# Patient Record
Sex: Male | Born: 1955 | Race: White | Hispanic: No
Health system: Southern US, Community
[De-identification: ages and names within clinical notes are randomized; demographics above are authoritative.]

## PROBLEM LIST (undated history)

## (undated) DIAGNOSIS — I10 Essential (primary) hypertension: Secondary | ICD-10-CM

## (undated) DIAGNOSIS — G454 Transient global amnesia: Principal | ICD-10-CM

## (undated) HISTORY — DX: Transient global amnesia: G45.4

---

## 2015-12-19 DIAGNOSIS — R4182 Altered mental status, unspecified: Secondary | ICD-10-CM | POA: Diagnosis present

## 2015-12-19 DIAGNOSIS — I10 Essential (primary) hypertension: Secondary | ICD-10-CM | POA: Insufficient documentation

## 2015-12-19 DIAGNOSIS — Z79899 Other long term (current) drug therapy: Secondary | ICD-10-CM | POA: Diagnosis not present

## 2015-12-19 DIAGNOSIS — G459 Transient cerebral ischemic attack, unspecified: Secondary | ICD-10-CM | POA: Insufficient documentation

## 2015-12-19 NOTE — ED Notes (Signed)
Pt screened by [facility].  Family reports pt unable to remember day of the week and if he worked today.  Wife states confusion started at 9pm.  Another visitor came up to [facility] and states they have been telling pt the day of the week multiple times and that disorientation started at 8pm.  No neuro deficits noted.  Pt alert and oriented at this time.  Pt states name, DOB, phone number, day of week, month, president, and location.

## 2015-12-20 ENCOUNTER — Emergency Department (HOSPITAL_COMMUNITY)
Admission: EM | Admit: 2015-12-20 | Discharge: 2015-12-20 | Disposition: A | Discharge status: Home / Self Care | Payer: BLUE CROSS/BLUE SHIELD | Attending: Emergency Medicine | Admitting: Emergency Medicine

## 2015-12-20 ENCOUNTER — Emergency Department (HOSPITAL_COMMUNITY): Payer: BLUE CROSS/BLUE SHIELD

## 2015-12-20 ENCOUNTER — Encounter (HOSPITAL_COMMUNITY): Payer: Self-pay | Admitting: Emergency Medicine

## 2015-12-20 DIAGNOSIS — G459 Transient cerebral ischemic attack, unspecified: Secondary | ICD-10-CM

## 2015-12-20 HISTORY — DX: Essential (primary) hypertension: I10

## 2015-12-20 LAB — COMPREHENSIVE METABOLIC PANEL
ALBUMIN: 3.9 g/dL (ref 3.5–5.0)
ALBUMIN: 4 g/dL (ref 3.5–5.0)
ALT: 30 U/L (ref 17–63)
ALT: 30 U/L (ref 17–63)
ANION GAP: 11 (ref 5–15)
AST: 51 U/L — ABNORMAL HIGH (ref 15–41)
AST: 81 U/L — AB (ref 15–41)
Alkaline Phosphatase: 52 U/L (ref 38–126)
Alkaline Phosphatase: 46 U/L (ref 38–126)
Anion gap: 16 — ABNORMAL HIGH (ref 5–15)
BUN: 12 mg/dL (ref 6–20)
BUN: 14 mg/dL (ref 6–20)
CALCIUM: 9.2 mg/dL (ref 8.9–10.3)
CHLORIDE: 104 mmol/L (ref 101–111)
CHLORIDE: 106 mmol/L (ref 101–111)
CO2: 18 mmol/L — ABNORMAL LOW (ref 22–32)
CO2: 23 mmol/L (ref 22–32)
CREATININE: 0.93 mg/dL (ref 0.61–1.24)
CREATININE: 0.98 mg/dL (ref 0.61–1.24)
Calcium: 8.8 mg/dL — ABNORMAL LOW (ref 8.9–10.3)
GFR calc Af Amer: >60 mL/min (ref >60)
GFR calc Af Amer: >60 mL/min (ref >60)
GFR calc non Af Amer: >60 mL/min (ref >60)
GFR calc non Af Amer: >60 mL/min (ref >60)
GLUCOSE: 110 mg/dL — AB (ref 65–99)
GLUCOSE: 113 mg/dL — AB (ref 65–99)
POTASSIUM: 5.9 mmol/L — AB (ref 3.5–5.1)
SODIUM: 140 mmol/L (ref 135–145)
Sodium: 138 mmol/L (ref 135–145)
Total Bilirubin: 2.8 mg/dL — ABNORMAL HIGH (ref 0.3–1.2)
Total Bilirubin: 6.2 mg/dL — ABNORMAL HIGH (ref 0.3–1.2)

## 2015-12-20 LAB — DIFFERENTIAL
BASOS PCT: 0 %
Basophils Absolute: 0 10*3/uL (ref 0.0–0.1)
EOS PCT: 3 %
Eosinophils Absolute: 0.2 10*3/uL (ref 0.0–0.7)
Lymphocytes Relative: 32 %
Lymphs Abs: 2.6 10*3/uL (ref 0.7–4.0)
MONO ABS: 0.6 10*3/uL (ref 0.1–1.0)
Monocytes Relative: 7 %
Neutro Abs: 4.7 10*3/uL (ref 1.7–7.7)
Neutrophils Relative %: 58 %

## 2015-12-20 LAB — I-STAT TROPONIN, ED: Troponin i, poc: 0 ng/mL (ref 0.00–0.08)

## 2015-12-20 LAB — PROTIME-INR
INR: 0.88 (ref 0.00–1.49)
Prothrombin Time: 12.2 seconds (ref 11.6–15.2)

## 2015-12-20 LAB — CBC
HEMATOCRIT: 45.6 % (ref 39.0–52.0)
HEMOGLOBIN: 15.7 g/dL (ref 13.0–17.0)
MCH: 32 pg (ref 26.0–34.0)
MCHC: 34.4 g/dL (ref 30.0–36.0)
MCV: 92.9 fL (ref 78.0–100.0)
PLATELETS: 334 10*3/uL (ref 150–400)
RBC: 4.91 MIL/uL (ref 4.22–5.81)
RDW: 12.6 % (ref 11.5–15.5)
WBC: 8.1 10*3/uL (ref 4.0–10.5)

## 2015-12-20 LAB — I-STAT CHEM 8, ED
BUN: 22 mg/dL — ABNORMAL HIGH (ref 6–20)
CALCIUM ION: 1.11 mmol/L — AB (ref 1.13–1.30)
CHLORIDE: 103 mmol/L (ref 101–111)
CREATININE: 0.9 mg/dL (ref 0.61–1.24)
GLUCOSE: 111 mg/dL — AB (ref 65–99)
HCT: 48 % (ref 39.0–52.0)
HEMOGLOBIN: 16.3 g/dL (ref 13.0–17.0)
Potassium: 4.7 mmol/L (ref 3.5–5.1)
Sodium: 140 mmol/L (ref 135–145)
TCO2: 29 mmol/L (ref 0–100)

## 2015-12-20 LAB — APTT: APTT: 33 s (ref 24–37)

## 2015-12-20 LAB — CBG MONITORING, ED: GLUCOSE-CAPILLARY: 118 mg/dL — AB (ref 65–99)

## 2015-12-20 NOTE — ED Notes (Signed)
MD at bedside. 

## 2015-12-20 NOTE — ED Notes (Signed)
Pt back in room.

## 2015-12-20 NOTE — ED Notes (Signed)
Lab called about hemolyzed blood. Blood redrawn by Nurse. Lab called and stated that blood appear to have lysis. Nurse confirmed that blood was drawn directly..Marland Kitchen

## 2015-12-20 NOTE — Discharge Instructions (Signed)
Take a full-strength aspirin daily. Call your doctor Monday morning for follow-up. You will need carotid ultrasound performed. Call for follow-up with one of the listed neurologist as well for further evaluation. If there are any further episodes, call 911 and come back to the ER immediately.  Transient Ischemic Attack A transient ischemic attack (TIA) is a "warning stroke" that causes stroke-like symptoms. Unlike a stroke, a TIA does not cause permanent damage to the brain. The symptoms of a TIA can happen very fast and do not last long. It is important to know the symptoms of a TIA and what to do. This can help prevent a major stroke or death. CAUSES  A TIA is caused by a temporary blockage in an artery in the brain or neck (carotid artery). The blockage does not allow the brain to get the blood supply it needs and can cause different symptoms. The blockage can be caused by either:  A blood clot.  Fatty buildup (plaque) in a neck or brain artery. RISK FACTORS  High blood pressure (hypertension).  High cholesterol.  Diabetes mellitus.  Heart disease.  The buildup of plaque in the blood vessels (peripheral artery disease or atherosclerosis).  The buildup of plaque in the blood vessels that provide blood and oxygen to the brain (carotid artery stenosis).  An abnormal heart rhythm (atrial fibrillation).  Obesity.  Using any tobacco products, including cigarettes, chewing tobacco, or electronic cigarettes.  Taking oral contraceptives, especially in combination with using tobacco.  Physical inactivity.  A diet high in fats, salt (sodium), and calories.  Excessive alcohol use.  Use of illegal drugs (especially cocaine and methamphetamine).  Being male.  Being African American.  Being over the age of 19 years.  Family history of stroke.  Previous history of blood clots, stroke, TIA, or heart attack.  Sickle cell disease. SIGNS AND SYMPTOMS  TIA symptoms are the same as a  stroke but are temporary. These symptoms usually develop suddenly, or may be newly present upon waking from sleep:  Sudden weakness or numbness of the face, arm, or leg, especially on one side of the body.  Sudden trouble walking or difficulty moving arms or legs.  Sudden confusion.  Sudden personality changes.  Trouble speaking (aphasia) or understanding.  Difficulty swallowing.  Sudden trouble seeing in one or both eyes.  Double vision.  Dizziness.  Loss of balance or coordination.  Sudden severe headache with no known cause.  Trouble reading or writing.  Loss of bowel or bladder control.  Loss of consciousness. DIAGNOSIS  Your health care provider may be able to determine the presence or absence of a TIA based on your symptoms, history, and physical exam. CT scan of the brain is usually performed to help identify a TIA. Other tests may include:  Electrocardiography (ECG).  Continuous heart monitoring.  Echocardiography.  Carotid ultrasonography.  MRI.  A scan of the brain circulation.  Blood tests. TREATMENT  Since the symptoms of TIA are the same as a stroke, it is important to seek treatment as soon as possible. You may need a medicine to dissolve a blood clot (thrombolytic) if that is the cause of the TIA. This medicine cannot be given if too much time has passed. Treatment may also include:   Rest, oxygen, fluids through an IV tube, and medicines to thin the blood (anticoagulants).  Measures will be taken to prevent short-term and long-term complications, including infection from breathing foreign material into the lungs (aspiration pneumonia), blood clots in the  legs, and falls.  Procedures to either remove plaque in the carotid arteries or dilate carotid arteries that have narrowed due to plaque. Those procedures are:  Carotid endarterectomy.  Carotid angioplasty and stenting.  Medicines and diet may be used to address diabetes, high blood pressure,  and other underlying risk factors. HOME CARE INSTRUCTIONS   Take medicines only as directed by your health care provider. Follow the directions carefully. Medicines may be used to control risk factors for a stroke. Be sure you understand all your medicine instructions.  You may be told to take aspirin or the anticoagulant warfarin. Warfarin needs to be taken exactly as instructed.  Taking too much or too little warfarin is dangerous. Too much warfarin increases the risk of bleeding. Too little warfarin continues to allow the risk for blood clots. While taking warfarin, you will need to have regular blood tests to measure your blood clotting time. A PT blood test measures how long it takes for blood to clot. Your PT is used to calculate another value called an INR. Your PT and INR help your health care provider to adjust your dose of warfarin. The dose can change for many reasons. It is critically important that you take warfarin exactly as prescribed.  Many foods, especially foods high in vitamin K can interfere with warfarin and affect the PT and INR. Foods high in vitamin K include spinach, kale, broccoli, cabbage, collard and turnip greens, Brussels sprouts, peas, cauliflower, seaweed, and parsley, as well as beef and pork liver, green tea, and soybean oil. You should eat a consistent amount of foods high in vitamin K. Avoid major changes in your diet, or notify your health care provider before changing your diet. Arrange a visit with a dietitian to answer your questions.  Many medicines can interfere with warfarin and affect the PT and INR. You must tell your health care provider about any and all medicines you take; this includes all vitamins and supplements. Be especially cautious with aspirin and anti-inflammatory medicines. Do not take or discontinue any prescribed or over-the-counter medicine except on the advice of your health care provider or pharmacist.  Warfarin can have side effects, such  as excessive bruising or bleeding. You will need to hold pressure over cuts for longer than usual. Your health care provider or pharmacist will discuss other potential side effects.  Avoid sports or activities that may cause injury or bleeding.  Be careful when shaving, flossing your teeth, or handling sharp objects.  Alcohol can change the body's ability to handle warfarin. It is best to avoid alcoholic drinks or consume only very small amounts while taking warfarin. Notify your health care provider if you change your alcohol intake.  Notify your dentist or other health care providers before procedures.  Eat a diet that includes 5 or more servings of fruits and vegetables each day. This may reduce the risk of stroke. Certain diets may be prescribed to address high blood pressure, high cholesterol, diabetes, or obesity.  A diet low in sodium, saturated fat, trans fat, and cholesterol is recommended to manage high blood pressure.  A diet low in saturated fat, trans fat, and cholesterol, and high in fiber may control cholesterol levels.  A controlled-carbohydrate, controlled-sugar diet is recommended to manage diabetes.  A reduced-calorie diet that is low in sodium, saturated fat, trans fat, and cholesterol is recommended to manage obesity.  Maintain a healthy weight.  Stay physically active. It is recommended that you get at least 30 minutes of  activity on most or all days.  Do not use any tobacco products, including cigarettes, chewing tobacco, or electronic cigarettes. If you need help quitting, ask your health care provider.  Limit alcohol intake to no more than 1 drink per day for nonpregnant women and 2 drinks per day for men. One drink equals 12 ounces of beer, 5 ounces of wine, or 1 ounces of hard liquor.  Do not abuse drugs.  A safe home environment is important to reduce the risk of falls. Your health care provider may arrange for specialists to evaluate your home. Having grab  bars in the bedroom and bathroom is often important. Your health care provider may arrange for equipment to be used at home, such as raised toilets and a seat for the shower.  Follow all instructions for follow-up with your health care provider. This is very important. This includes any referrals and lab tests. Proper follow-up can prevent a stroke or another TIA from occurring. PREVENTION  The risk of a TIA can be decreased by appropriately treating high blood pressure, high cholesterol, diabetes, heart disease, and obesity, and by quitting smoking, limiting alcohol, and staying physically active. SEEK MEDICAL CARE IF:  You have personality changes.  You have difficulty swallowing.  You are seeing double.  You have dizziness.  You have a fever. SEEK IMMEDIATE MEDICAL CARE IF:  Any of the following symptoms may represent a serious problem that is an emergency. Do not wait to see if the symptoms will go away. Get medical help right away. Call your local emergency services (911 in [place] Do not drive yourself to the hospital.  You have sudden weakness or numbness of the face, arm, or leg, especially on one side of the body.  You have sudden trouble walking or difficulty moving arms or legs.  You have sudden confusion.  You have trouble speaking (aphasia) or understanding.  You have sudden trouble seeing in one or both eyes.  You have a loss of balance or coordination.  You have a sudden, severe headache with no known cause.  You have new chest pain or an irregular heartbeat.  You have a partial or total loss of consciousness. MAKE SURE YOU:   Understand these instructions.  Will watch your condition.  Will get help right away if you are not doing well or get worse.   This information is not intended to replace advice given to you by your health care provider. Make sure you discuss any questions you have with your health care provider.   Document Released: 03/03/2005  Document Revised: 06/14/2014 Document Reviewed: 08/29/2013 Elsevier Interactive Patient Education [employer]

## 2015-12-20 NOTE — ED Provider Notes (Signed)
CSN: [id number]     Arrival date & time 12/19/15  2348 History  By signing my name below, I, Emmanuella Mensah, attest that this documentation has been prepared under the direction and in the presence of Gilda Crease, MD. Electronically Signed: Angelene Giovanni, ED Scribe. 12/20/2015. 12:53 AM.    Chief Complaint  Patient presents with  . Altered Mental Status   Patient is a 60 y.o. male presenting with altered mental status. The history is provided by the patient. No language interpreter was used.  Altered Mental Status Presenting symptoms: confusion and memory loss   Severity:  Moderate Most recent episode:  Today Episode history:  Single Duration:  2 hours Progression:  Resolved Chronicity:  New Associated symptoms: normal movement, no bladder incontinence, no fever, no nausea, no slurred speech, no vomiting and no weakness    HPI Comments: Luis Kelley is a 60 y.o. male with a hx of hypertension who presents to the Emergency Department for evaluation for a resolved episode of possible altered mental status that occurred at 9 pm yesterday lasting for 2 hours. Pt's wife states that after pt went to take a shower, he started speaking as if he had just went to bed and was not remembering day of the week or events that had occurred earlier in the day. She states that she was downstairs while pt was in the shower upstairs and denies hearing pt fall. She adds that pt had normal speech and had no facial droop, or one-sided weakness during the episode. Pt also denies any pain or evidence that he fell. He states that he does not remember anything from the 2 hours or even actually taking a shower. He has a family hx of stroke with his sister. No fever, chills, n/v, bowel/bladder incontinence, weakness, or numbness.    Past Medical History  Diagnosis Date  . Hypertension    History reviewed. No pertinent past surgical history. No family history on file. Social History  Substance Use  Topics  . Smoking status: Never Smoker   . Smokeless tobacco: None  . Alcohol Use: Yes    Review of Systems  Constitutional: Negative for fever and chills.  Gastrointestinal: Negative for nausea and vomiting.  Genitourinary: Negative for bladder incontinence.  Musculoskeletal: Negative for myalgias and arthralgias.  Neurological: Negative for weakness and numbness.  Psychiatric/Behavioral: Positive for memory loss and confusion.  All other systems reviewed and are negative.     Allergies  Review of patient's allergies indicates no known allergies.  Home Medications   Prior to Admission medications   Medication Sig Start Date End Date Taking? Authorizing Provider  CRANBERRY PO Take 1 tablet by mouth daily.   Yes Historical Provider, MD  losartan (COZAAR) 50 MG tablet Take 12.5 mg by mouth daily.   Yes Historical Provider, MD  saw palmetto (RA SAW PALMETTO) 80 MG capsule Take 80 mg by mouth 2 (two) times daily.   Yes Historical Provider, MD  vitamin E 1000 UNIT capsule Take 1,000 Units by mouth daily.   Yes Historical Provider, MD   BP 155/103 mmHg  Pulse 79  Temp(Src) 98.7 F (37.1 C) (Oral)  Resp 15  Ht  (1.626 m)  Wt 155 lb (70.308 kg)  BMI 26.59 kg/m2  SpO2 97% Physical Exam  Constitutional: He is oriented to person, place, and time. He appears well-developed and well-nourished. No distress.  HENT:  Head: Normocephalic and atraumatic.  Right Ear: Hearing normal.  Left Ear: Hearing normal.  Nose:  Nose normal.  Mouth/Throat: Oropharynx is clear and moist and mucous membranes are normal.  Eyes: Conjunctivae and EOM are normal. Pupils are equal, round, and reactive to light.  Neck: Normal range of motion. Neck supple.  Cardiovascular: Regular rhythm, S1 normal and S2 normal.  Exam reveals no gallop and no friction rub.   No murmur heard. Pulmonary/Chest: Effort normal and breath sounds normal. No respiratory distress. He exhibits no tenderness.  Abdominal: Soft.  Normal appearance and bowel sounds are normal. There is no hepatosplenomegaly. There is no tenderness. There is no rebound, no guarding, no tenderness at McBurney's point and negative Murphy's sign. No hernia.  Musculoskeletal: Normal range of motion.  Neurological: He is alert and oriented to person, place, and time. He has normal strength. No cranial nerve deficit or sensory deficit. Coordination normal. GCS eye subscore is 4. GCS verbal subscore is 5. GCS motor subscore is 6.  Extraocular muscle movement: normal No visual field cut Pupils: equal and reactive both direct and consensual response is normal No nystagmus present    Sensory function is intact to light touch, pinprick Proprioception intact  Grip strength 5/5 symmetric in upper extremities No pronator drift Normal finger to nose bilaterally  Lower extremity strength 5/5 against gravity Normal heel to shin bilaterally  Gait: normal   Skin: Skin is warm, dry and intact. No rash noted. No cyanosis.  Psychiatric: He has a normal mood and affect. His speech is normal and behavior is normal. Thought content normal.  Nursing note and vitals reviewed.   ED Course  Procedures (including critical care time) DIAGNOSTIC STUDIES: Oxygen Saturation is 99% on RA, normal by my interpretation.    COORDINATION OF CARE: 12:51 AM- Pt advised of plan for treatment and pt agrees. Will consult Neurology for time frame of treatment.     Labs Review Labs Reviewed  COMPREHENSIVE METABOLIC PANEL - Abnormal; Notable for the following:    Potassium 5.9 (*)    Glucose, Bld 110 (*)    AST 51 (*)    Total Bilirubin 2.8 (*)    All other components within normal limits  COMPREHENSIVE METABOLIC PANEL - Abnormal; Notable for the following:    CO2 18 (*)    Glucose, Bld 113 (*)    Calcium 8.8 (*)    AST 81 (*)    Total Bilirubin 6.2 (*)    Anion gap 16 (*)    All other components within normal limits  CBG MONITORING, ED - Abnormal; Notable  for the following:    Glucose-Capillary 118 (*)    All other components within normal limits  I-STAT CHEM 8, ED - Abnormal; Notable for the following:    BUN 22 (*)    Glucose, Bld 111 (*)    Calcium, Ion 1.11 (*)    All other components within normal limits  PROTIME-INR  APTT  CBC  DIFFERENTIAL  I-STAT TROPOININ, ED    Imaging Review Ct Head Wo Contrast  12/20/2015  CLINICAL DATA:  60 year old male with altered mental status and forgetfulness EXAM: CT HEAD WITHOUT CONTRAST TECHNIQUE: Contiguous axial images were obtained from the base of the skull through the vertex without intravenous contrast. COMPARISON:  None. FINDINGS: The ventricles and the sulci are appropriate in size for the patient's age. There is no intracranial hemorrhage. No midline shift or mass effect identified. The gray-white matter differentiation is preserved. The visualized paranasal sinuses and mastoid air cells are well aerated. Right maxillary sinus retention cyst or polyp. The calvarium  is intact. IMPRESSION: No acute intracranial hemorrhage. Electronically Signed   By: Elgie Collard M.D.   On: 12/20/2015 01:44   Mr Brain Wo Contrast  12/20/2015  CLINICAL DATA:  Initial evaluation for acute encephalopathy. EXAM: MRI HEAD WITHOUT CONTRAST TECHNIQUE: Multiplanar, multiecho pulse sequences of the brain and surrounding structures were obtained without intravenous contrast. COMPARISON:  Prior CT from earlier same day. FINDINGS: Age-related cerebral volume loss present. Mild scattered patchy T2/FLAIR hyperintensity within the periventricular and deep white matter both cerebral hemispheres, nonspecific, but most likely related to mild chronic small vessel ischemic disease. No abnormal foci of restricted diffusion to suggest acute infarct. Punctate signal abnormality within the medial aspect of the right caudate head on axial DWI sequence favored to be artifactual in nature. Gray-white matter differentiation maintained. Major  intracranial vascular flow voids are preserved. No acute or chronic intracranial hemorrhage. No areas of chronic infarction. No mass lesion, midline shift, or mass effect. No hydrocephalus. No extra-axial fluid collection. Major dural sinuses are grossly patent. Craniocervical junction normal. Visualized upper cervical spine unremarkable. Pituitary gland within normal limits. No acute abnormality about the globes and orbits. Retention cyst within the right maxillary sinus. Scattered mucosal thickening within the ethmoidal air cells and sphenoid sinuses. Paranasal sinuses are otherwise clear. No mastoid effusion. Inner ear structures grossly normal. Bone marrow signal intensity within normal limits. No scalp soft tissue abnormality. IMPRESSION: 1. No acute intracranial process identified. 2. Mild cerebral white matter changes for patient age, most likely related to mild chronic small vessel ischemic disease. Electronically Signed   By: Rise Mu M.D.   On: 12/20/2015 04:31     Gilda Crease, MD has personally reviewed and evaluated these images and lab results as part of his medical decision-making.   EKG Interpretation   Date/Time:  Saturday December 20 2015 00:06:46 EDT Ventricular Rate:  90 PR Interval:  120 QRS Duration: 88 QT Interval:  358 QTC Calculation: 437 R Axis:   70 Text Interpretation:  Normal sinus rhythm Normal ECG Confirmed by Meghanne Pletz   MD, Aquilla Voiles ([id number]) on 12/20/2015 12:09:57 AM      MDM   Final diagnoses:  Transient cerebral ischemia, unspecified transient cerebral ischemia type    Patient presents to the ER for evaluation of altered mental status. Patient was with his wife and was behaving normally. He went in to take a shower and when he came out he was confused, unable to remember what had occurred earlier in the day and was asking questions repeatedly. There was no focal neurologic findings such as facial droop, unilateral weakness in his  extremities. Patient has improved now and is back to his baseline.  Differential diagnosis would include TIA and seizure. He does not have any outward signs of trauma that would make me suspect head injury as a cause of his symptoms. No known history of fall. He did not have urinary incontinence or bite his tongue prior to arrival, seizure is felt to be less likely.  Workup has been negative. I did consult Dr. Roseanne Reno, on call for neurology. Patient's history, presentation, risk factors, neurologic examination were discussed. Dr. Roseanne Reno felt that if the patient underwent MRI and it was normal, he can be discharged for outpatient follow-up. MRI has been performed and shows small vessel disease but nothing acute. Patient will be discharged and given follow-up with neurology. He was recommended to take a full-strength aspirin daily until follow-up.  I personally performed the services described in this documentation, which was  scribed in my presence. The recorded information has been reviewed and is accurate.     Gilda Creasehristopher J Gelisa Tieken, MD 12/20/15 [phone]

## 2015-12-20 NOTE — ED Notes (Signed)
Patient transported to MRI. Luis Kelley

## 2015-12-20 NOTE — ED Notes (Signed)
Family reports pt unable to remember day of the week and if he worked today. Wife states confusion started at 9pm. Another visitor came up to [facility] and states they have been telling pt the day of the week multiple times and that disorientation started at 8pm. No neuro deficits noted. Pt alert and oriented at this time. Pt states name, DOB, phone number, day of week, month, president, and location.  Pt states he feels fine at this time.  Denies any complaints.

## 2015-12-24 ENCOUNTER — Ambulatory Visit (INDEPENDENT_AMBULATORY_CARE_PROVIDER_SITE_OTHER): Payer: BLUE CROSS/BLUE SHIELD | Admitting: Neurology

## 2015-12-24 ENCOUNTER — Encounter: Payer: Self-pay | Admitting: Neurology

## 2015-12-24 ENCOUNTER — Telehealth: Payer: Self-pay | Admitting: Neurology

## 2015-12-24 VITALS — BP 168/96 | HR 72 | Ht 64.0 in | Wt 166.5 lb

## 2015-12-24 DIAGNOSIS — G454 Transient global amnesia: Secondary | ICD-10-CM | POA: Diagnosis not present

## 2015-12-24 HISTORY — DX: Transient global amnesia: G45.4

## 2015-12-24 NOTE — Patient Instructions (Signed)
Transient Global Amnesia °Transient global amnesia causes a sudden and temporary (transient) loss of memory (amnesia). While you may recall memories from your distant past, including being able to recognize people you know well, you may not recall things that happened more recently in the past days, months, or even year. A transient global amnesia episode does not last longer than 24 hours.  °Transient global amnesia does not affect your other brain functions. Your memory usually returns to normal after an episode is over. One episode of transient global amnesia does not make you more likely to have a stroke, a relapse, or other complications.  °CAUSES  °The cause of this condition is not known.  °RISK FACTORS  °Transient global amnesia is more likely to develop in people who: °· Are 50-70 years old. °· Have a history of migraine headaches. °SYMPTOMS  °The main symptoms of this condition include: °· The inability to remember recent events. °· Asking repetitive questions about the situation and surroundings and not recalling the answers to these questions. °Other symptoms include: °· Restlessness and nervousness. °· Confusion. °· Headaches. °· Dizziness. °· Nausea. °DIAGNOSIS  °Your health care provider may suspect transient global amnesia based on your symptoms. Your health care provider will do a physical exam. This may include a test to check your mental abilities (cognitive evaluation). You may also have imaging studies done to check your brain function. These may include:  °· Electroencephalography (EEG). °· Diffusion-weighted imaging (DWI). °· MRI. °TREATMENT  °There is no treatment for this condition. An episode typically goes away on its own after a few hours. If you also have a seizure or migraine during an episode, you will receive treatment for these conditions. This may include medicines. °HOME CARE INSTRUCTIONS °· Take medicines only as directed by your health care provider. °· Tell your family or  friends that you have transient global amnesia. Ask them to help you avoid physical exertion, including sexual intercourse, swimming, and straining while holding your breath (Valsalva maneuver), until the episode passes. These are events that can bring on transient global amnesia attacks. °SEEK MEDICAL CARE IF:  °· You have a migraine and it does not go away after you have followed your treatment plan for this condition. °· You have a seizure for the first time, or a seizure that is different from seizures you normally have. °· You experience transient global amnesia repeatedly. °  °This information is not intended to replace advice given to you by your health care provider. Make sure you discuss any questions you have with your health care provider. °  °Document Released: 07/01/2004 Document Revised: 02/12/2015 Document Reviewed: 02/06/2014 °Elsevier Interactive Patient Education ©2016 [employer] ° °

## 2015-12-24 NOTE — Progress Notes (Signed)
Reason for visit: transient global amnesia  Referring physician: [place]  Luis Kelley is a 60 y.o. male  History of present illness:  Luis Kelley is a 60 year old right-handed white male with a history of an episode of transient amnesia that occurred on 12/20/2015. The patient was at home with his wife, he had just taken a shower and he had come downstairs. He was having a conversation with his wife which initially was well oriented. Suddenly, the patient began to ask questions about what he had done that day, he had no recollection of events throughout the day. The patient was asking questions again and again, not being able to remember what he had just been told. The patient had no alteration of sensorium, he did not slur his speech, and he did not have any obvious weakness of the face, arms, or legs. There was no change in balance. The patient did not report headache or sensory alteration. The patient talked with his brother, he decided to go to the emergency room, and an evaluation was done. MRI of the brain was unremarkable. Blood work showed an elevation in the total bilirubin and with the SGOT. The patient reports a history of hypertriglyceridemia. The patient does not follow-up regularly with his primary care physician. He has been placed on low-dose aspirin, he was sent to this office for an evaluation. The patient denies any previous history of transient anemia. His sisters had a single seizure when she was in her late teenage years. His father has a history of migraine headache. The patient himself denied any history of headache associated with the event above.  Past Medical History  Diagnosis Date  . Hypertension   . 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 No pertinent past surgical history.  Family History  Problem Relation Age of Onset  . Hypertension Father   . Migraines Father   . Diabetes Mother   . Seizures Sister   . Migraines Brother     Social  history:  reports that he has never smoked. He does not have any smokeless tobacco history on file. He reports that he drinks alcohol. He reports that he does not use illicit drugs.  Medications:  Prior to Admission medications   Medication Sig Start Date End Date Taking? Authorizing Provider  aspirin 325 MG tablet Take 325 mg by mouth daily.   Yes Historical Provider, MD  CRANBERRY PO Take 1 tablet by mouth daily.   Yes Historical Provider, MD  losartan (COZAAR) 50 MG tablet Take 12.5 mg by mouth daily.   Yes Historical Provider, MD  saw palmetto (RA SAW PALMETTO) 80 MG capsule Take 80 mg by mouth 2 (two) times daily.   Yes Historical Provider, MD  vitamin E 1000 UNIT capsule Take 1,000 Units by mouth daily.   Yes Historical Provider, MD     No Known Allergies  ROS:  Out of a complete 14 system review of symptoms, the patient complains only of the following symptoms, and all other reviewed systems are negative.  Memory loss, confusion  Blood pressure 168/96, pulse 72, height 5\' 4"  (1.626 m), weight 166 lb 8 oz (75.524 kg).  Physical Exam  General: The patient is alert and cooperative at the time of the examination.  Eyes: Pupils are equal, round, and reactive to light. Discs are flat bilaterally.  Neck: The neck is supple, no carotid bruits are noted.  Respiratory: The respiratory examination is clear.  Cardiovascular: The cardiovascular examination reveals  a regular rate and rhythm, no obvious murmurs or rubs are noted.  Skin: Extremities are without significant edema.  Neurologic Exam  Mental status: The patient is alert and oriented x 3 at the time of the examination. The patient has apparent normal recent and remote memory, with an apparently normal attention span and concentration ability.  Cranial nerves: Facial symmetry is present. There is good sensation of the face to pinprick and soft touch bilaterally. The strength of the facial muscles and the muscles to head  turning and shoulder shrug are normal bilaterally. Speech is well enunciated, no aphasia or dysarthria is noted. Extraocular movements are full. Visual fields are full. The tongue is midline, and the patient has symmetric elevation of the soft palate. No obvious hearing deficits are noted.  Motor: The motor testing reveals 5 over 5 strength of all 4 extremities. Good symmetric motor tone is noted throughout.  Sensory: Sensory testing is intact to pinprick, soft touch, vibration sensation, and position sense on all 4 extremities. No evidence of extinction is noted.  Coordination: Cerebellar testing reveals good finger-nose-finger and heel-to-shin bilaterally.  Gait and station: Gait is normal. Tandem gait is normal. Romberg is negative. No drift is seen.  Reflexes: Deep tendon reflexes are symmetric and normal bilaterally. Toes are downgoing bilaterally.   MRI brain 12/20/15:  IMPRESSION: 1. No acute intracranial process identified. 2. Mild cerebral white matter changes for patient age, most likely related to mild chronic small vessel ischemic disease.  * MRI scan images were reviewed online. I agree with the written report.    Assessment/Plan:  1. Transient global amnesia  The patient has had a fairly typical episode of transient global amnesia lasting about 2 hours. He has never regained memory for the events that occurred during the period of amnesia. The patient will be sent for an EEG study, and a carotid Doppler study. He is to remain on low-dose aspirin. He will follow-up through this office if needed.  Marlan Palau MD 12/24/2015 7:59 PM  [facility] [address] [place], [place] [address]  Phone [phone] Fax [phone]

## 2015-12-24 NOTE — Telephone Encounter (Signed)
Patient called back to advise Carollee HerterShannon, insurance will cover US Carotid Bilateral, as long as is done in our office.

## 2015-12-25 ENCOUNTER — Ambulatory Visit (INDEPENDENT_AMBULATORY_CARE_PROVIDER_SITE_OTHER): Payer: BLUE CROSS/BLUE SHIELD | Admitting: Neurology

## 2015-12-25 ENCOUNTER — Telehealth: Payer: Self-pay | Admitting: Neurology

## 2015-12-25 DIAGNOSIS — R4182 Altered mental status, unspecified: Secondary | ICD-10-CM

## 2015-12-25 DIAGNOSIS — G454 Transient global amnesia: Secondary | ICD-10-CM

## 2015-12-25 NOTE — Telephone Encounter (Signed)
Hey shannon Patient ready to be scheduled for doppler no pa needed. Thanks Annabelle Harmanana

## 2015-12-26 ENCOUNTER — Telehealth: Payer: Self-pay | Admitting: Neurology

## 2015-12-26 NOTE — Procedures (Signed)
    History:  Luis OvermanWilliam Kelley is a 60 year old gentleman with a history of transient global amnesia that occurred on 12/20/2015. Episode lasted about 2 hours with full clearing. MRI of the brain was unremarkable. The patient is being evaluated for this event.  This is a routine EEG. No skull defects are noted. Medications include aspirin, Cozaar, and vitamin E supplementation.   EEG classification: Normal awake  Description of the recording: The background rhythms of this recording consists of a fairly well modulated medium amplitude alpha rhythm of 10 Hz that is reactive to eye opening and closure. As the record progresses, the patient appears to remain in the waking state throughout the recording. Photic stimulation was performed, resulting in a bilateral and symmetric photic driving response. Hyperventilation was also performed, resulting in a minimal buildup of the background rhythm activities without significant slowing seen. At no time during the recording does there appear to be evidence of spike or spike wave discharges or evidence of focal slowing. EKG monitor shows no evidence of cardiac rhythm abnormalities with a heart rate of 90.  Impression: This is a normal EEG recording in the waking state. No evidence of ictal or interictal discharges are seen.

## 2015-12-26 NOTE — Telephone Encounter (Signed)
I called patient. The EEG study was normal. 

## 2016-01-07 ENCOUNTER — Other Ambulatory Visit: Payer: BLUE CROSS/BLUE SHIELD

## 2016-01-21 ENCOUNTER — Ambulatory Visit (INDEPENDENT_AMBULATORY_CARE_PROVIDER_SITE_OTHER): Payer: BLUE CROSS/BLUE SHIELD

## 2016-01-21 DIAGNOSIS — G454 Transient global amnesia: Secondary | ICD-10-CM

## 2016-01-22 ENCOUNTER — Telehealth: Payer: Self-pay | Admitting: Neurology

## 2016-01-22 NOTE — Telephone Encounter (Signed)
The carotid Doppler study was done yesterday, I do not yet have the results. I called the wife.

## 2016-01-22 NOTE — Telephone Encounter (Signed)
Pt's wife called requesting carotid results

## 2016-02-02 ENCOUNTER — Telehealth: Payer: Self-pay | Admitting: Neurology

## 2016-02-02 NOTE — Telephone Encounter (Signed)
I called patient. The carotid Doppler study was unremarkable, the patient is to contact me if he has any further concerns or questions.

## 2016-02-02 NOTE — Telephone Encounter (Signed)
Pt's wife(she was on speaker and pt could hear the converstation) returned Dr Lacretia NicksW call. Msg relayed and understood by the pt. They had no questions.

## 2016-02-17 ENCOUNTER — Ambulatory Visit: Payer: BLUE CROSS/BLUE SHIELD | Admitting: Neurology

## 2017-09-21 IMAGING — CT CT HEAD W/O CM
4 series · 16 of 47 positions shown, 18 images · non-contrast
Comparison: None.

CLINICAL DATA: 59-year-old male with altered mental status and
forgetfulness

EXAM:
CT HEAD WITHOUT CONTRAST
TECHNIQUE: Contiguous axial images were obtained from the base of the skull
through the vertex without intravenous contrast.

[Series 2: head without · axial · non-contrast · 0.42mm/px · z∈[-160,-40]mm · 7 of 34 slices shown, 9 images]
[im 5/34  brain]
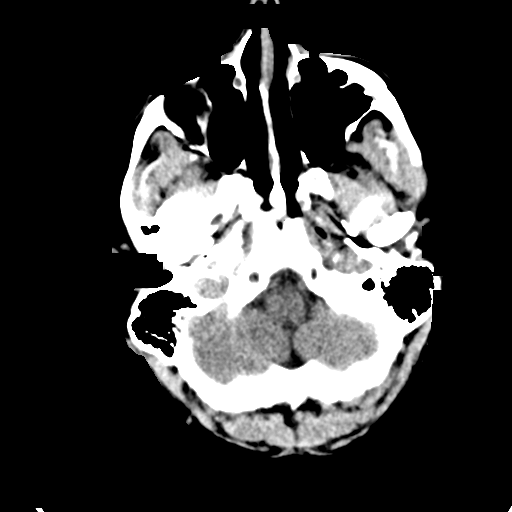
[im 5/34  bone]
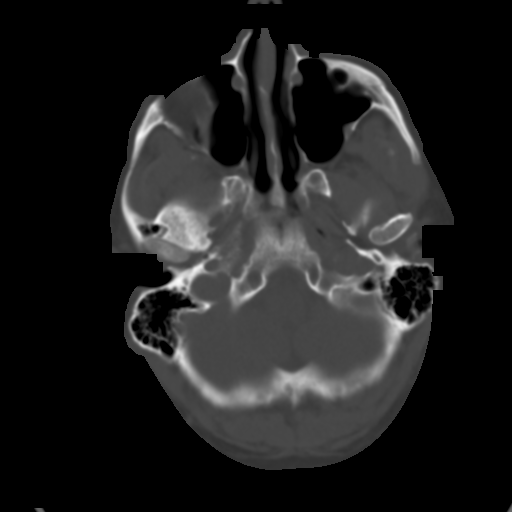
[im 9/34  brain]
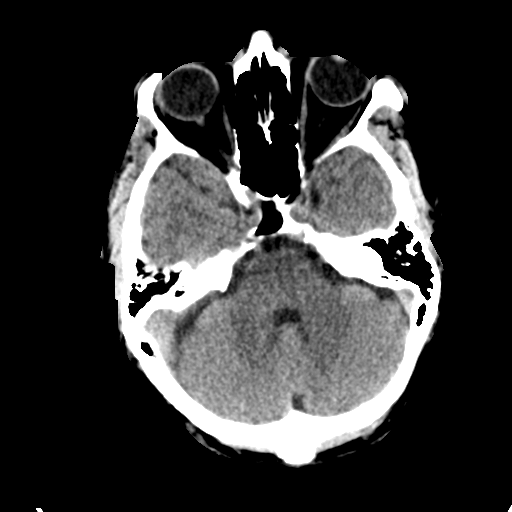
[im 13/34  brain]
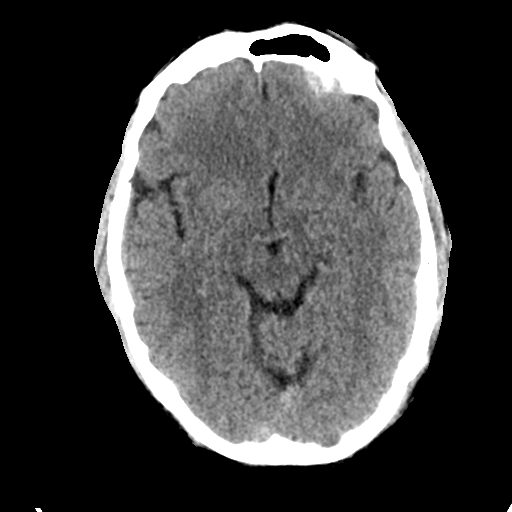
[im 17/34  brain]
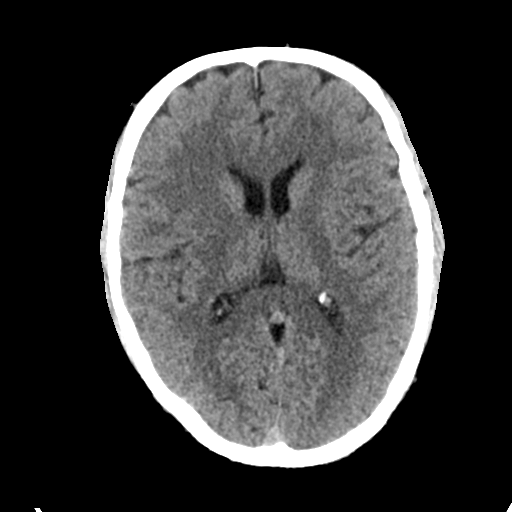
[im 21/34  brain]
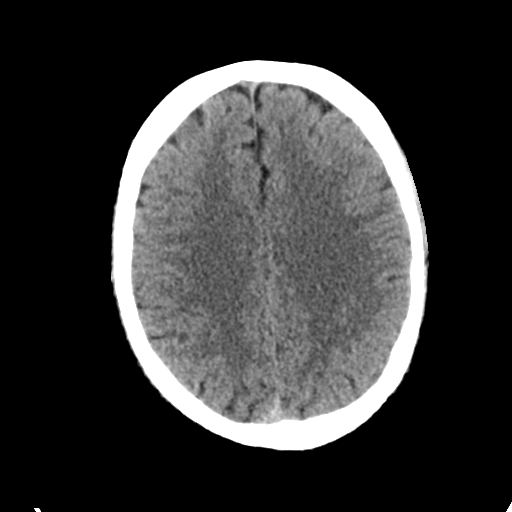
[im 21/34  bone]
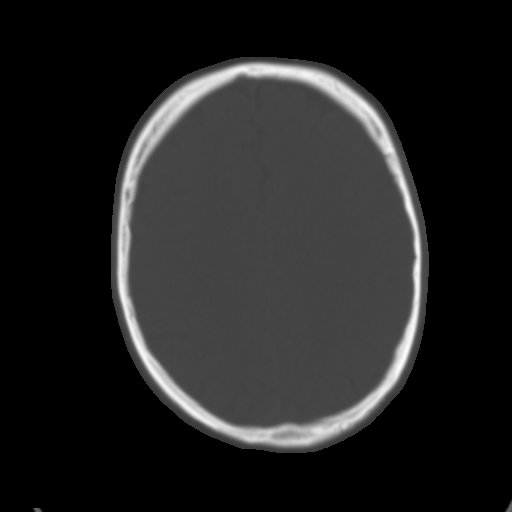
[im 25/34  brain]
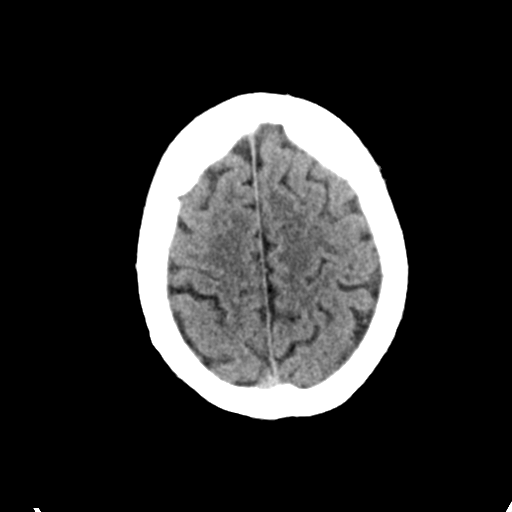
[im 29/34  brain]
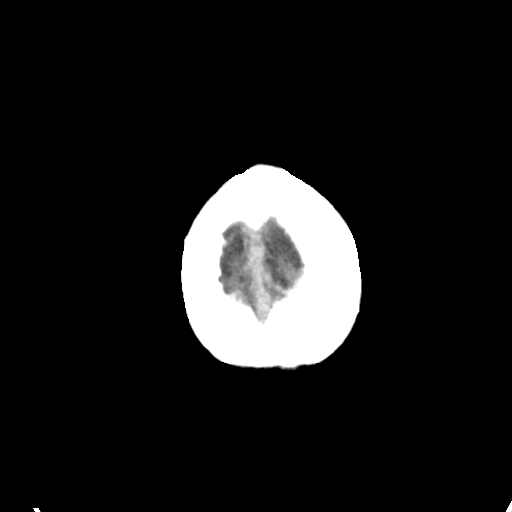

[Series 3: head bone · axial · 0.42mm/px · z∈[-164,-132]mm · 3 of 84 slices shown]
[im 9/84  bone]
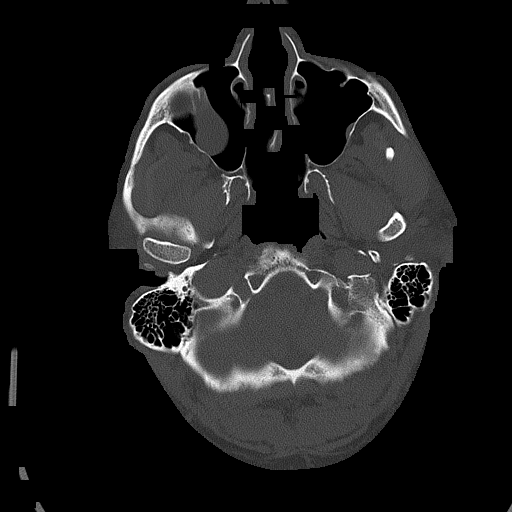
[im 17/84  bone]
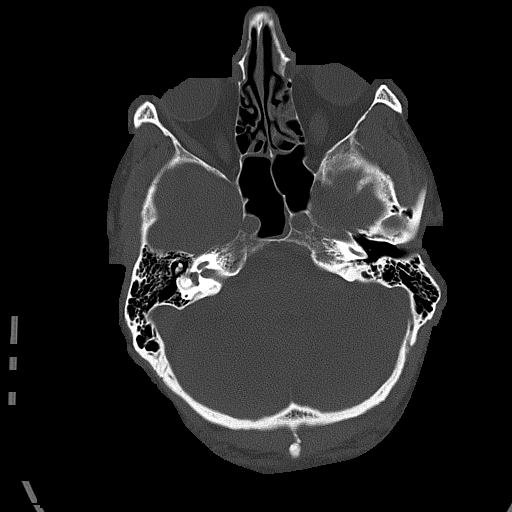
[im 25/84  bone]
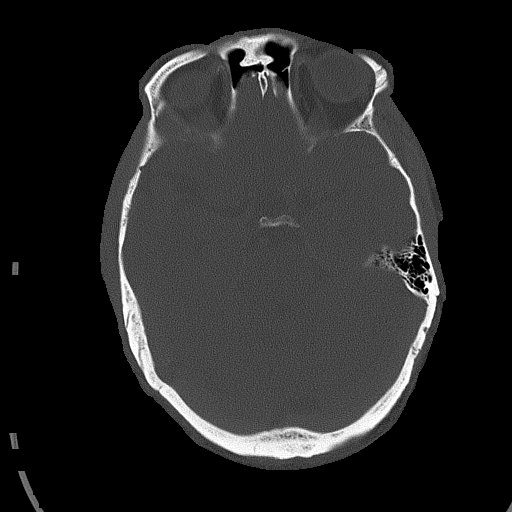

[Series 4: head without cor · coronal · non-contrast · 0.30mm/px · 3 of 66 slices shown]
[im 22/66  brain]
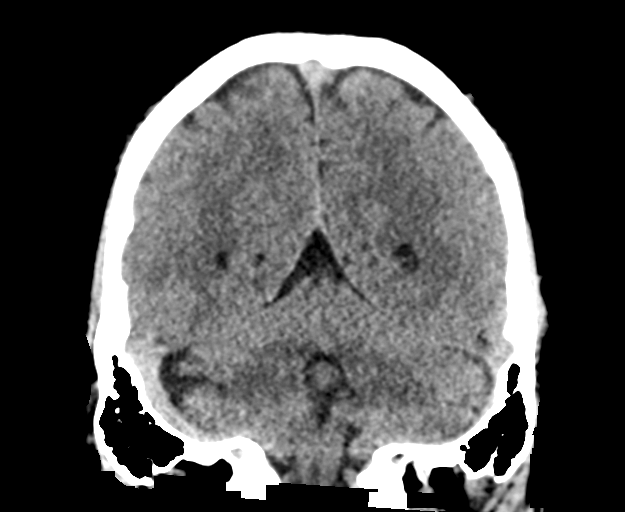
[im 29/66  brain]
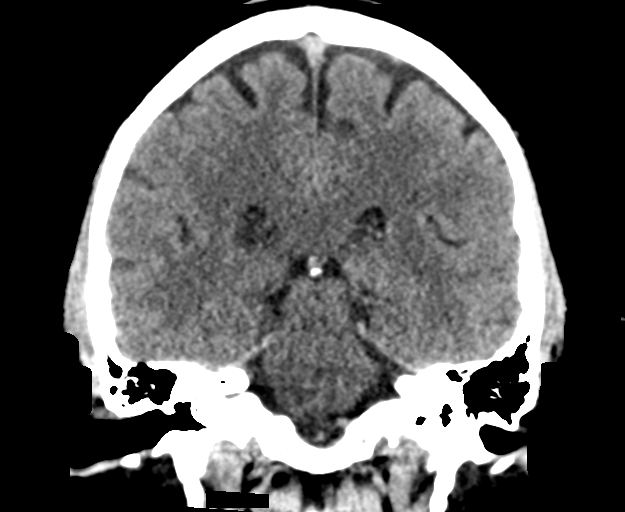
[im 37/66  brain]
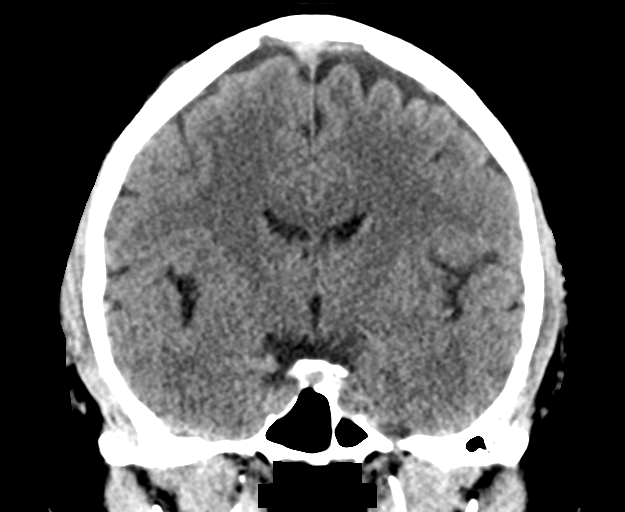

[Series 5: head without sag · sagittal · non-contrast · 0.32mm/px · 3 of 56 slices shown]
[im 19/56  brain]
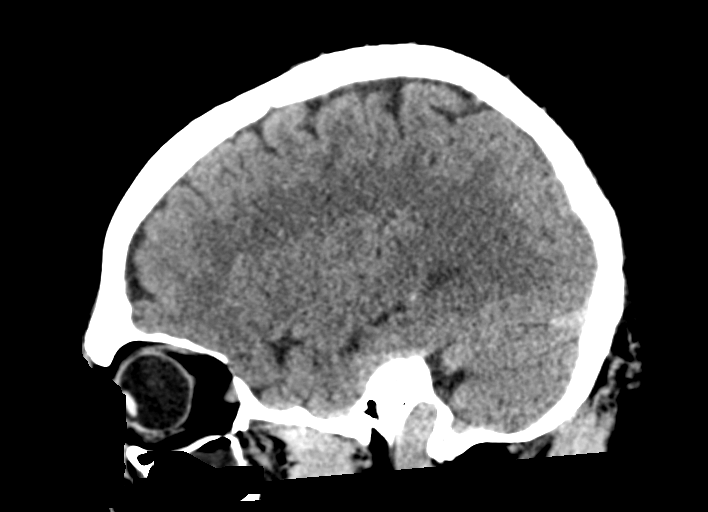
[im 28/56  brain]
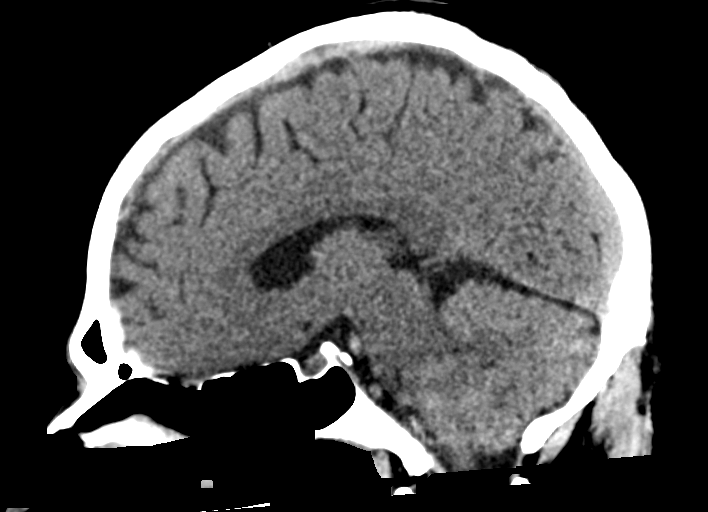
[im 37/56  brain]
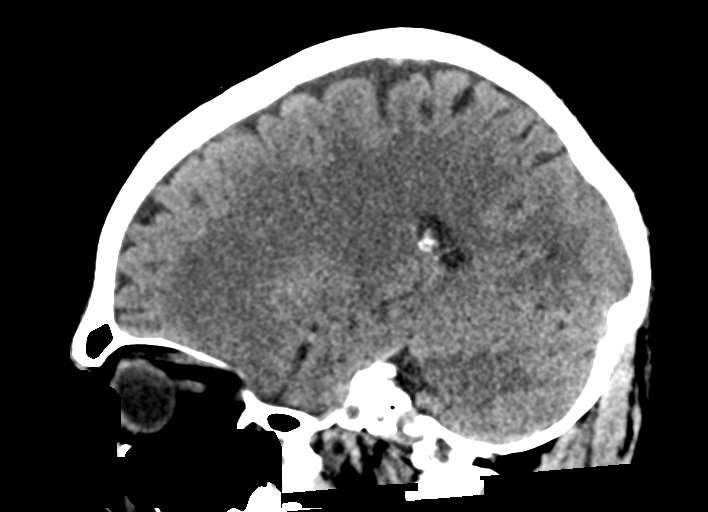

[16 of 47 positions shown; findings below may reference images not displayed]

FINDINGS: The ventricles and the sulci are appropriate in size for the
patient's age. There is no intracranial hemorrhage. No midline shift
or mass effect identified. The gray-white matter differentiation is
preserved.

The visualized paranasal sinuses and mastoid air cells are well
aerated. Right maxillary sinus retention cyst or polyp. The
calvarium is intact.
IMPRESSION: No acute intracranial hemorrhage.

## 2017-09-21 IMAGING — MR MR HEAD W/O CM
9 of 10 series · 35 of 48 positions shown · non-contrast
Comparison: Prior CT from earlier same day.

CLINICAL DATA: Initial evaluation for acute encephalopathy.

EXAM:
MRI HEAD WITHOUT CONTRAST
TECHNIQUE: Multiplanar, multiecho pulse sequences of the brain and surrounding
structures were obtained without intravenous contrast.

[Series 3: T1 · sagittal · 5.0mm · 0.47mm/px · 1 of 25 slices shown]
[im 1/25]
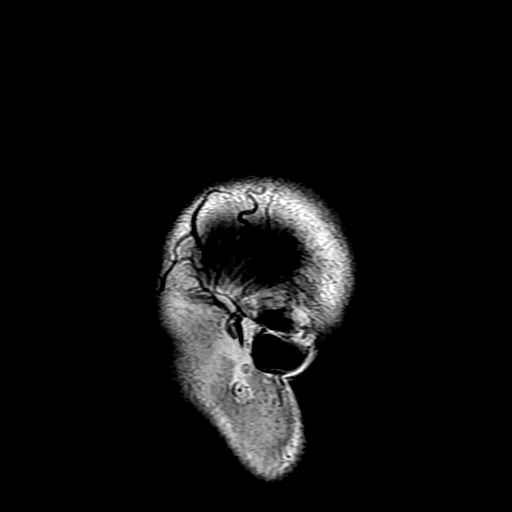

[Series 4: DWI · axial · 3.0mm · 1.09mm/px · z∈[-76,+73]mm · 8 of 102 slices shown (1 of 4)]
[im 1/102]
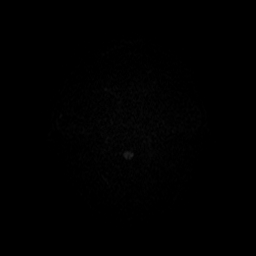
[im 12/102]
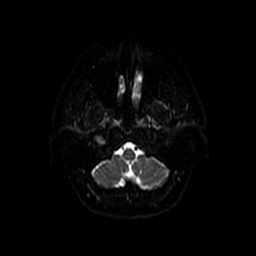
[im 34/102]
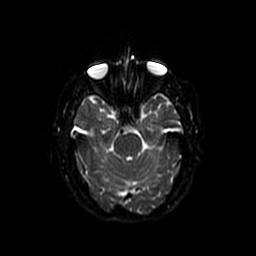
[im 45/102]
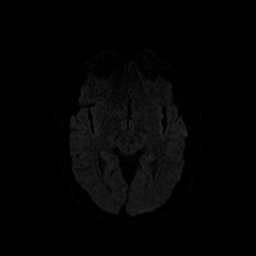
[im 57/102]
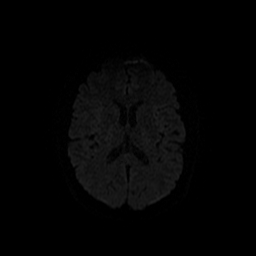
[im 68/102]
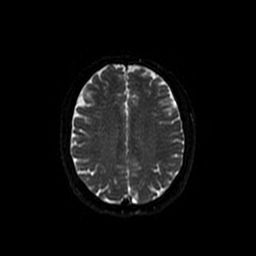
[im 90/102]
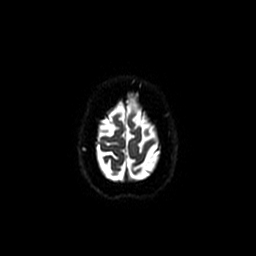
[im 102/102]
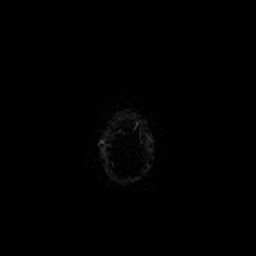

[Series 5: DWI · coronal · 5.0mm · 1.09mm/px · 7 of 70 slices shown (2 of 4)]
[im 1/70]
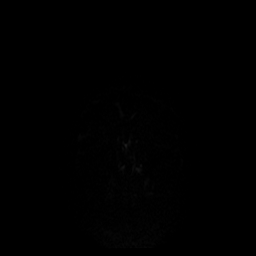
[im 12/70]
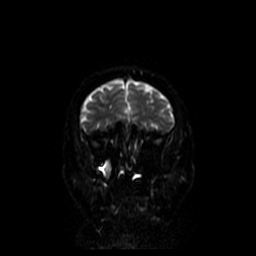
[im 24/70]
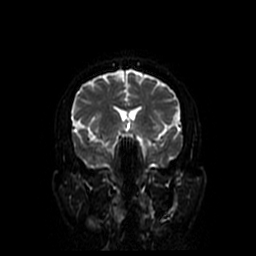
[im 35/70]
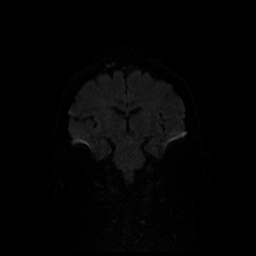
[im 47/70]
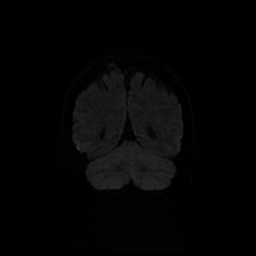
[im 58/70]
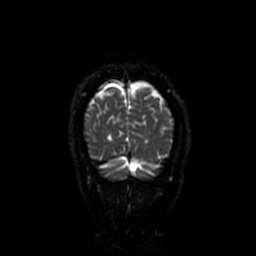
[im 70/70]
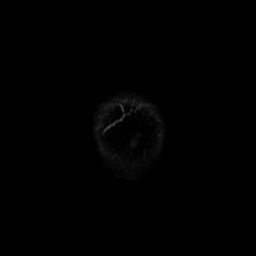

[Series 6: T2 · axial · 5.0mm · 0.43mm/px · z∈[-73,+75]mm · 3 of 26 slices shown (1 of 2)]
[im 1/26]
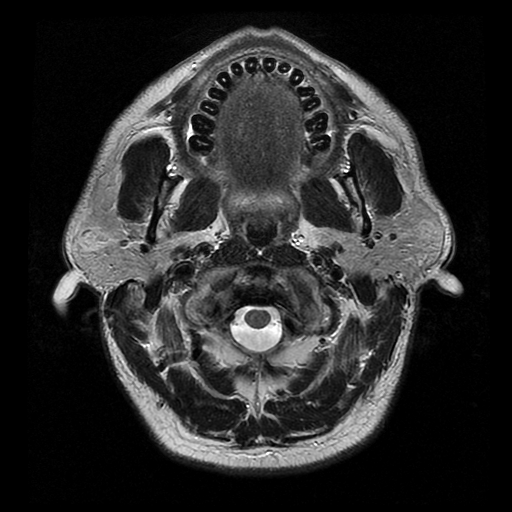
[im 13/26]
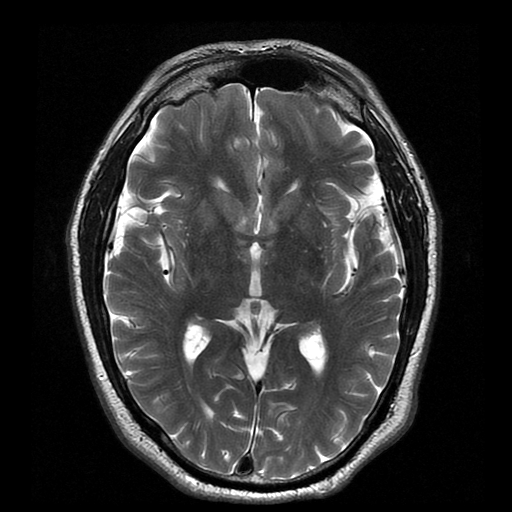
[im 26/26]
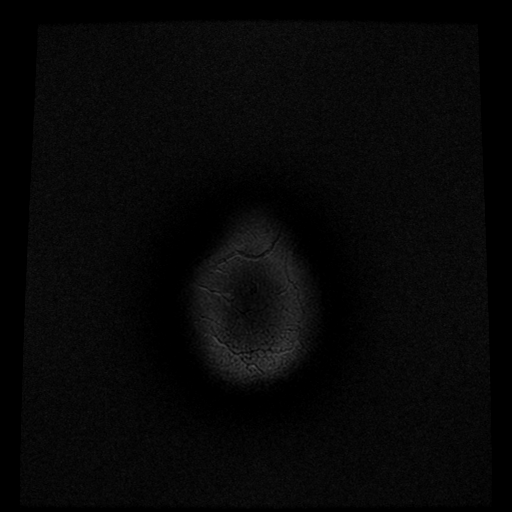

[Series 7: FLAIR · axial · 5.0mm · 0.43mm/px · z∈[-73,+75]mm · 3 of 26 slices shown]
[im 1/26]
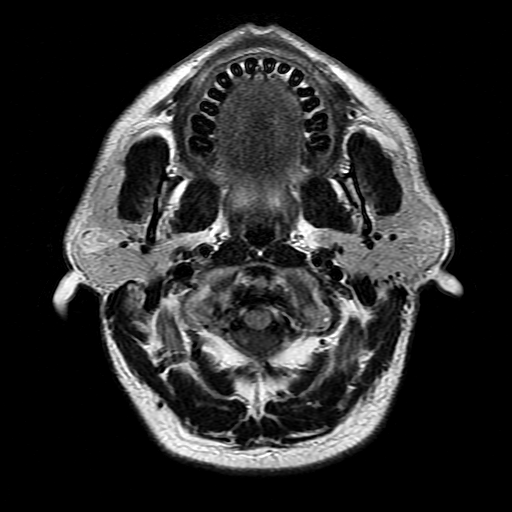
[im 13/26]
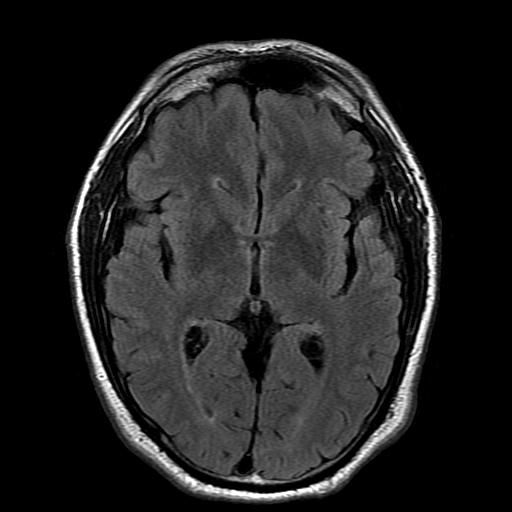
[im 26/26]
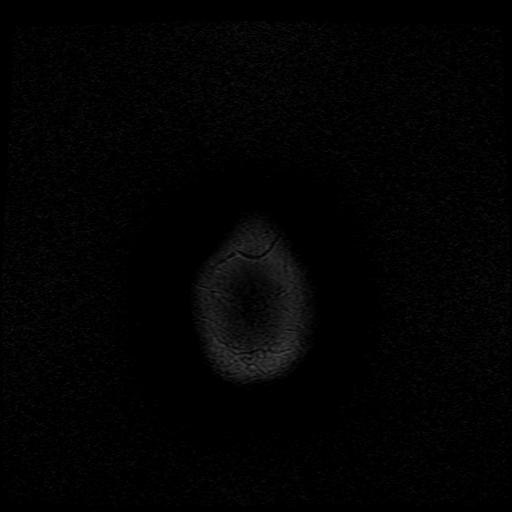

[Series 8: ax mpgr · axial · 5.0mm · 0.45mm/px · z∈[-74,-2]mm · 2 of 26 slices shown]
[im 1/26]
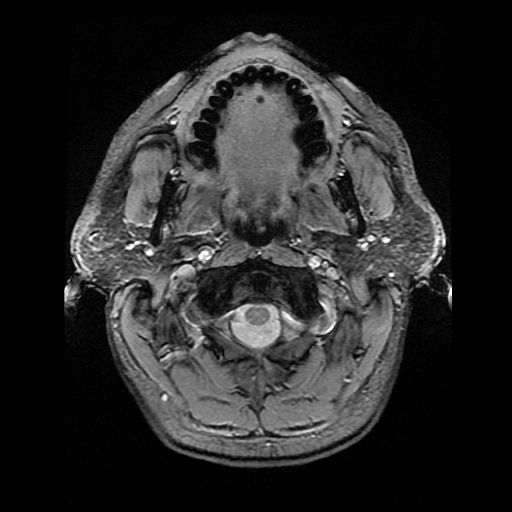
[im 13/26]
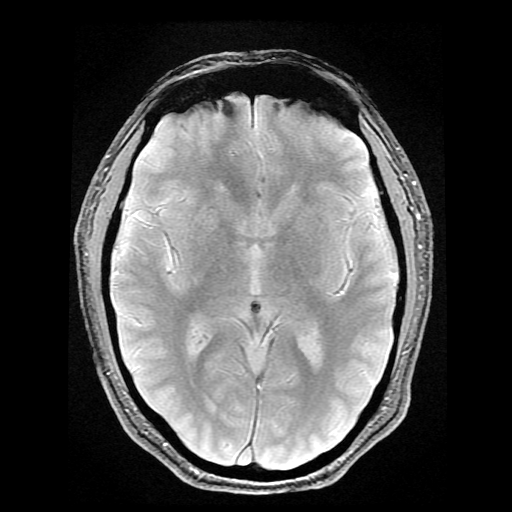

[Series 10: T2 · coronal · 5.0mm · 0.39mm/px · 3 of 27 slices shown (2 of 2)]
[im 1/27]
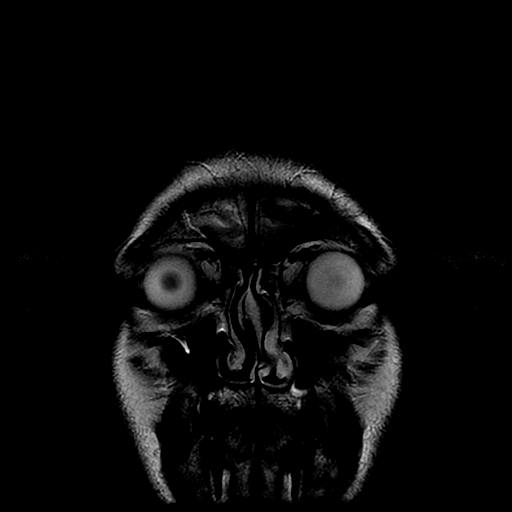
[im 14/27]
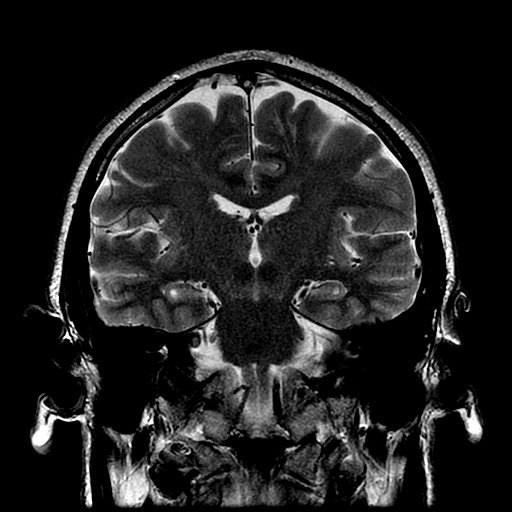
[im 27/27]
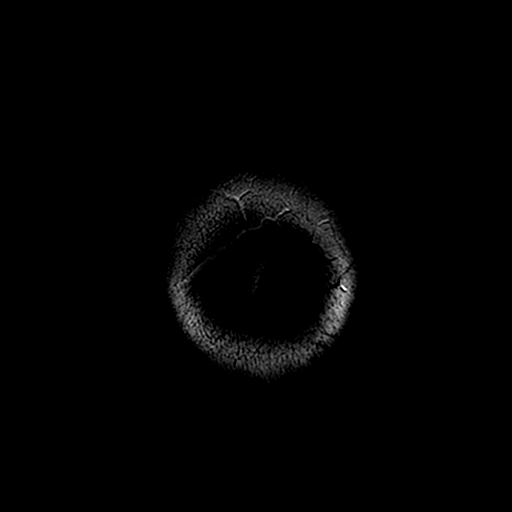

[Series 400: DWI · axial · 3.0mm · 1.09mm/px · z∈[-76,+73]mm · 5 of 51 slices shown (3 of 4)]
[im 1/51]
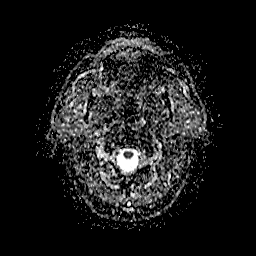
[im 13/51]
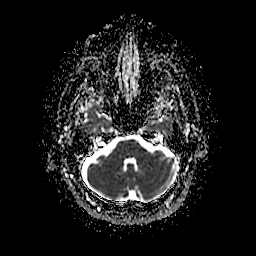
[im 26/51]
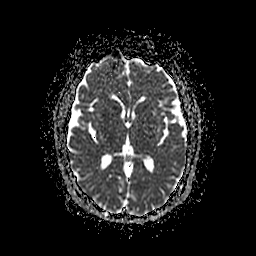
[im 38/51]
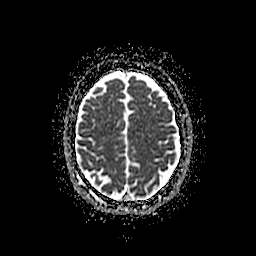
[im 51/51]
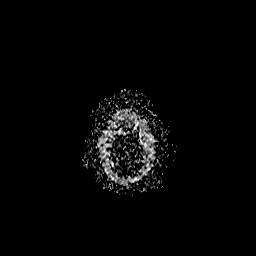

[Series 500: DWI · coronal · 5.0mm · 1.09mm/px · 3 of 35 slices shown (4 of 4)]
[im 1/35]
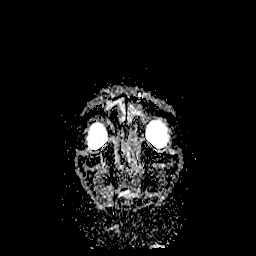
[im 18/35]
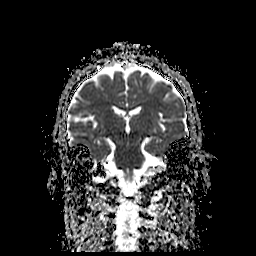
[im 35/35]
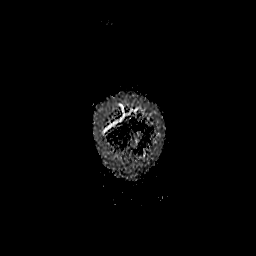

[35 of 48 positions shown; findings below may reference images not displayed]

FINDINGS: Age-related cerebral volume loss present. Mild scattered patchy
T2/FLAIR hyperintensity within the periventricular and deep white
matter both cerebral hemispheres, nonspecific, but most likely
related to mild chronic small vessel ischemic disease.

No abnormal foci of restricted diffusion to suggest acute infarct.
Punctate signal abnormality within the medial aspect of the right
caudate head on axial DWI sequence favored to be artifactual in
nature. Gray-white matter differentiation maintained. Major
intracranial vascular flow voids are preserved. No acute or chronic
intracranial hemorrhage. No areas of chronic infarction.

No mass lesion, midline shift, or mass effect. No hydrocephalus. No
extra-axial fluid collection. Major dural sinuses are grossly
patent.

Craniocervical junction normal. Visualized upper cervical spine
unremarkable.

Pituitary gland within normal limits. No acute abnormality about the
globes and orbits.

Retention cyst within the right maxillary sinus. Scattered mucosal
thickening within the ethmoidal air cells and sphenoid sinuses.
Paranasal sinuses are otherwise clear. No mastoid effusion. Inner
ear structures grossly normal.

Bone marrow signal intensity within normal limits. No scalp soft
tissue abnormality.
IMPRESSION: 1. No acute intracranial process identified.
2. Mild cerebral white matter changes for patient age, most likely
related to mild chronic small vessel ischemic disease.

## 2022-06-03 ENCOUNTER — Other Ambulatory Visit (HOSPITAL_COMMUNITY): Payer: Self-pay | Admitting: Family Medicine

## 2022-06-03 DIAGNOSIS — N179 Acute kidney failure, unspecified: Secondary | ICD-10-CM

## 2023-01-04 DIAGNOSIS — L980 Pyogenic granuloma: Secondary | ICD-10-CM | POA: Diagnosis not present

## 2023-02-03 DIAGNOSIS — L980 Pyogenic granuloma: Secondary | ICD-10-CM | POA: Diagnosis not present

## 2023-02-10 DIAGNOSIS — L980 Pyogenic granuloma: Secondary | ICD-10-CM | POA: Diagnosis not present

## 2023-02-10 DIAGNOSIS — C44622 Squamous cell carcinoma of skin of right upper limb, including shoulder: Secondary | ICD-10-CM | POA: Diagnosis not present

## 2023-03-22 DIAGNOSIS — L821 Other seborrheic keratosis: Secondary | ICD-10-CM | POA: Diagnosis not present

## 2023-03-22 DIAGNOSIS — L820 Inflamed seborrheic keratosis: Secondary | ICD-10-CM | POA: Diagnosis not present

## 2023-03-22 DIAGNOSIS — C44622 Squamous cell carcinoma of skin of right upper limb, including shoulder: Secondary | ICD-10-CM | POA: Diagnosis not present

## 2023-03-22 DIAGNOSIS — L578 Other skin changes due to chronic exposure to nonionizing radiation: Secondary | ICD-10-CM | POA: Diagnosis not present

## 2023-03-24 DIAGNOSIS — H25813 Combined forms of age-related cataract, bilateral: Secondary | ICD-10-CM | POA: Diagnosis not present

## 2023-04-26 DIAGNOSIS — I10 Essential (primary) hypertension: Secondary | ICD-10-CM | POA: Diagnosis not present

## 2023-04-26 DIAGNOSIS — R7303 Prediabetes: Secondary | ICD-10-CM | POA: Diagnosis not present

## 2023-04-27 DIAGNOSIS — N320 Bladder-neck obstruction: Secondary | ICD-10-CM | POA: Diagnosis not present

## 2023-04-27 DIAGNOSIS — I10 Essential (primary) hypertension: Secondary | ICD-10-CM | POA: Diagnosis not present

## 2023-04-27 DIAGNOSIS — R109 Unspecified abdominal pain: Secondary | ICD-10-CM | POA: Diagnosis not present

## 2023-04-27 DIAGNOSIS — D2362 Other benign neoplasm of skin of left upper limb, including shoulder: Secondary | ICD-10-CM | POA: Diagnosis not present

## 2023-04-27 DIAGNOSIS — I700 Atherosclerosis of aorta: Secondary | ICD-10-CM | POA: Diagnosis not present

## 2023-04-27 DIAGNOSIS — R7303 Prediabetes: Secondary | ICD-10-CM | POA: Diagnosis not present

## 2023-04-27 DIAGNOSIS — E781 Pure hyperglyceridemia: Secondary | ICD-10-CM | POA: Diagnosis not present

## 2023-09-20 DIAGNOSIS — T1502XA Foreign body in cornea, left eye, initial encounter: Secondary | ICD-10-CM | POA: Diagnosis not present

## 2023-09-21 DIAGNOSIS — T1502XD Foreign body in cornea, left eye, subsequent encounter: Secondary | ICD-10-CM | POA: Diagnosis not present

## 2023-10-24 DIAGNOSIS — I10 Essential (primary) hypertension: Secondary | ICD-10-CM | POA: Diagnosis not present

## 2023-10-24 DIAGNOSIS — E781 Pure hyperglyceridemia: Secondary | ICD-10-CM | POA: Diagnosis not present

## 2023-10-24 DIAGNOSIS — R7303 Prediabetes: Secondary | ICD-10-CM | POA: Diagnosis not present

## 2023-10-25 DIAGNOSIS — Z0000 Encounter for general adult medical examination without abnormal findings: Secondary | ICD-10-CM | POA: Diagnosis not present

## 2023-10-25 DIAGNOSIS — Z0189 Encounter for other specified special examinations: Secondary | ICD-10-CM | POA: Diagnosis not present

## 2023-10-25 DIAGNOSIS — I10 Essential (primary) hypertension: Secondary | ICD-10-CM | POA: Diagnosis not present

## 2023-10-25 DIAGNOSIS — E781 Pure hyperglyceridemia: Secondary | ICD-10-CM | POA: Diagnosis not present

## 2023-10-25 DIAGNOSIS — D582 Other hemoglobinopathies: Secondary | ICD-10-CM | POA: Diagnosis not present

## 2023-10-25 DIAGNOSIS — R7303 Prediabetes: Secondary | ICD-10-CM | POA: Diagnosis not present

## 2024-03-15 DIAGNOSIS — L739 Follicular disorder, unspecified: Secondary | ICD-10-CM | POA: Diagnosis not present

## 2024-03-26 DIAGNOSIS — H43392 Other vitreous opacities, left eye: Secondary | ICD-10-CM | POA: Diagnosis not present

## 2024-03-26 DIAGNOSIS — H25813 Combined forms of age-related cataract, bilateral: Secondary | ICD-10-CM | POA: Diagnosis not present

## 2024-03-26 DIAGNOSIS — H25093 Other age-related incipient cataract, bilateral: Secondary | ICD-10-CM | POA: Diagnosis not present

## 2024-03-27 DIAGNOSIS — L578 Other skin changes due to chronic exposure to nonionizing radiation: Secondary | ICD-10-CM | POA: Diagnosis not present

## 2024-03-27 DIAGNOSIS — L820 Inflamed seborrheic keratosis: Secondary | ICD-10-CM | POA: Diagnosis not present

## 2024-03-27 DIAGNOSIS — L570 Actinic keratosis: Secondary | ICD-10-CM | POA: Diagnosis not present

## 2024-03-27 DIAGNOSIS — L821 Other seborrheic keratosis: Secondary | ICD-10-CM | POA: Diagnosis not present
